# Patient Record
Sex: Female | Born: 1975 | Race: White | Hispanic: No | Marital: Married | Smoking: Never smoker
Health system: Southern US, Community
[De-identification: ages and names within clinical notes are randomized; demographics above are authoritative.]

---

## 2015-04-06 ENCOUNTER — Emergency Department (HOSPITAL_BASED_OUTPATIENT_CLINIC_OR_DEPARTMENT_OTHER)
Admission: EM | Admit: 2015-04-06 | Discharge: 2015-04-07 | Disposition: A | Discharge status: Home / Self Care | Payer: Managed Care, Other (non HMO) | Attending: Emergency Medicine | Admitting: Emergency Medicine

## 2015-04-06 ENCOUNTER — Encounter (HOSPITAL_BASED_OUTPATIENT_CLINIC_OR_DEPARTMENT_OTHER): Payer: Self-pay | Admitting: *Deleted

## 2015-04-06 DIAGNOSIS — R1084 Generalized abdominal pain: Secondary | ICD-10-CM | POA: Diagnosis not present

## 2015-04-06 DIAGNOSIS — Z3202 Encounter for pregnancy test, result negative: Secondary | ICD-10-CM | POA: Insufficient documentation

## 2015-04-06 DIAGNOSIS — R112 Nausea with vomiting, unspecified: Secondary | ICD-10-CM | POA: Diagnosis not present

## 2015-04-06 DIAGNOSIS — R1013 Epigastric pain: Secondary | ICD-10-CM | POA: Diagnosis present

## 2015-04-06 LAB — PREGNANCY, URINE: Preg Test, Ur: NEGATIVE

## 2015-04-06 LAB — CBC
HEMATOCRIT: 45.6 % (ref 36.0–46.0)
Hemoglobin: 15.1 g/dL — ABNORMAL HIGH (ref 12.0–15.0)
MCH: 29 pg (ref 26.0–34.0)
MCHC: 33.1 g/dL (ref 30.0–36.0)
MCV: 87.5 fL (ref 78.0–100.0)
Platelets: 316 10*3/uL (ref 150–400)
RBC: 5.21 MIL/uL — AB (ref 3.87–5.11)
RDW: 12.8 % (ref 11.5–15.5)
WBC: 8.7 10*3/uL (ref 4.0–10.5)

## 2015-04-06 LAB — COMPREHENSIVE METABOLIC PANEL
ALK PHOS: 117 U/L (ref 38–126)
ALT: 37 U/L (ref 14–54)
AST: 23 U/L (ref 15–41)
Albumin: 4.3 g/dL (ref 3.5–5.0)
Anion gap: 8 (ref 5–15)
BILIRUBIN TOTAL: 0.6 mg/dL (ref 0.3–1.2)
BUN: 14 mg/dL (ref 6–20)
CALCIUM: 8.7 mg/dL — AB (ref 8.9–10.3)
CO2: 28 mmol/L (ref 22–32)
Chloride: 103 mmol/L (ref 101–111)
Creatinine, Ser: 1.05 mg/dL — ABNORMAL HIGH (ref 0.44–1.00)
GFR calc Af Amer: >60 mL/min (ref >60)
GFR calc non Af Amer: >60 mL/min (ref >60)
GLUCOSE: 173 mg/dL — AB (ref 65–99)
Potassium: 3.8 mmol/L (ref 3.5–5.1)
Sodium: 139 mmol/L (ref 135–145)
TOTAL PROTEIN: 7.8 g/dL (ref 6.5–8.1)

## 2015-04-06 LAB — URINALYSIS, ROUTINE W REFLEX MICROSCOPIC
Bilirubin Urine: NEGATIVE
Glucose, UA: NEGATIVE mg/dL
Hgb urine dipstick: NEGATIVE
Ketones, ur: NEGATIVE mg/dL
Leukocytes, UA: NEGATIVE
NITRITE: NEGATIVE
Protein, ur: NEGATIVE mg/dL
Specific Gravity, Urine: 1.022 (ref 1.005–1.030)
UROBILINOGEN UA: 0.2 mg/dL (ref 0.0–1.0)
pH: 6 (ref 5.0–8.0)

## 2015-04-06 LAB — LIPASE, BLOOD: LIPASE: 18 U/L — AB (ref 22–51)

## 2015-04-06 NOTE — ED Notes (Signed)
Pt c/o diffuse abd pain which radiates around both flanks x 3 days

## 2015-04-07 ENCOUNTER — Encounter (HOSPITAL_BASED_OUTPATIENT_CLINIC_OR_DEPARTMENT_OTHER): Payer: Self-pay | Admitting: Emergency Medicine

## 2015-04-07 ENCOUNTER — Ambulatory Visit (HOSPITAL_COMMUNITY)
Admission: RE | Admit: 2015-04-07 | Discharge: 2015-04-07 | Disposition: A | Discharge status: Home / Self Care | Payer: Managed Care, Other (non HMO) | Source: Ambulatory Visit | Attending: Emergency Medicine | Admitting: Emergency Medicine

## 2015-04-07 DIAGNOSIS — R109 Unspecified abdominal pain: Secondary | ICD-10-CM | POA: Diagnosis not present

## 2015-04-07 NOTE — ED Provider Notes (Signed)
CSN: [id number]     Arrival date & time 04/06/15  2221 History   First MD Initiated Contact with Patient 04/07/15 0103     Chief Complaint  Patient presents with  . Abdominal Pain     (Consider location/radiation/quality/duration/timing/severity/associated sxs/prior Treatment) HPI  This is a 39 year old female without significant past medical history. She is here with abdominal pain that began about 3 hours prior to arrival. The pain originated in the epigastrium then spread diffusely around her entire abdomen and flanks. She describes the pain as feeling like a basketball was inflating inside her. There was associated nausea and vomiting but no diarrhea. Her pain was a 10 out of 10 at its worst but has subsequently improved and is now only a 3 out of 10. She was not aware of having a fever but felt hot and cold at times. The pain was present before she ate but worsened after she ate. The pain is not worse with movement or palpation. She does have a family history of cholelithiasis.  History reviewed. No pertinent past medical history. History reviewed. No pertinent past surgical history. History reviewed. No pertinent family history. History  Substance Use Topics  . Smoking status: Never Smoker   . Smokeless tobacco: Not on file  . Alcohol Use: No   OB History    No data available     Review of Systems  All other systems reviewed and are negative.   Allergies  Pneumococcal vaccines  Home Medications   Prior to Admission medications   Not on File   BP 143/95 mmHg  Pulse 79  Temp(Src) 98.1 F (36.7 C) (Oral)  Resp 18  Ht 5\' 10"  (1.778 m)  Wt 270 lb (122.471 kg)  BMI 38.74 kg/m2  SpO2 95%   Physical Exam  General: Well-developed, obese female in no acute distress; appearance consistent with age of record HENT: normocephalic; atraumatic Eyes: pupils equal, round and reactive to light; extraocular muscles intact Neck: supple Heart: regular rate and rhythm Lungs:  clear to auscultation bilaterally Abdomen: soft; nondistended; nontender; no masses or hepatosplenomegaly; bowel sounds present GU: No CVA tenderness Extremities: No deformity; full range of motion; pulses normal Neurologic: Awake, alert and oriented; motor function intact in all extremities and symmetric; no facial droop Skin: Warm and dry Psychiatric: Normal mood and affect    ED Course  Procedures (including critical care time)   MDM   Nursing notes and vitals signs, including pulse oximetry, reviewed.  Summary of this visit's results, reviewed by myself:  Labs:  Results for orders placed or performed during the hospital encounter of 04/06/15 (from the past 24 hour(s))  Pregnancy, urine     Status: None   Collection Time: 04/06/15 10:40 PM  Result Value Ref Range   Preg Test, Ur NEGATIVE NEGATIVE  Urinalysis, Routine w reflex microscopic (not at [facility])     Status: None   Collection Time: 04/06/15 10:40 PM  Result Value Ref Range   Color, Urine YELLOW YELLOW   APPearance CLEAR CLEAR   Specific Gravity, Urine 1.022 1.005 - 1.030   pH 6.0 5.0 - 8.0   Glucose, UA NEGATIVE NEGATIVE mg/dL   Hgb urine dipstick NEGATIVE NEGATIVE   Bilirubin Urine NEGATIVE NEGATIVE   Ketones, ur NEGATIVE NEGATIVE mg/dL   Protein, ur NEGATIVE NEGATIVE mg/dL   Urobilinogen, UA 0.2 0.0 - 1.0 mg/dL   Nitrite NEGATIVE NEGATIVE   Leukocytes, UA NEGATIVE NEGATIVE  CBC     Status: Abnormal   Collection  Time: 04/06/15 10:40 PM  Result Value Ref Range   WBC 8.7 4.0 - 10.5 K/uL   RBC 5.21 (H) 3.87 - 5.11 MIL/uL   Hemoglobin 15.1 (H) 12.0 - 15.0 g/dL   HCT 16.1 09.6 - 04.5 %   MCV 87.5 78.0 - 100.0 fL   MCH 29.0 26.0 - 34.0 pg   MCHC 33.1 30.0 - 36.0 g/dL   RDW 40.9 81.1 - 91.4 %   Platelets 316 150 - 400 K/uL  Comprehensive metabolic panel     Status: Abnormal   Collection Time: 04/06/15 10:40 PM  Result Value Ref Range   Sodium 139 135 - 145 mmol/L   Potassium 3.8 3.5 - 5.1 mmol/L   Chloride  103 101 - 111 mmol/L   CO2 28 22 - 32 mmol/L   Glucose, Bld 173 (H) 65 - 99 mg/dL   BUN 14 6 - 20 mg/dL   Creatinine, Ser 7.82 (H) 0.44 - 1.00 mg/dL   Calcium 8.7 (L) 8.9 - 10.3 mg/dL   Total Protein 7.8 6.5 - 8.1 g/dL   Albumin 4.3 3.5 - 5.0 g/dL   AST 23 15 - 41 U/L   ALT 37 14 - 54 U/L   Alkaline Phosphatase 117 38 - 126 U/L   Total Bilirubin 0.6 0.3 - 1.2 mg/dL   GFR calc non Af Amer >60 >60 mL/min   GFR calc Af Amer >60 >60 mL/min   Anion gap 8 5 - 15  Lipase, blood     Status: Abnormal   Collection Time: 04/06/15 10:40 PM  Result Value Ref Range   Lipase 18 (L) 22 - 51 U/L   1:19 AM The patient's symptoms are improving and her lab work is unremarkable. Her abdomen is nontender. I do not feel a CT scan is indicated the present time. We will have her return for an ultrasound later this morning. She was advised she is should return should symptoms worsen or change and we will reconsider CT scan.  Paula Libra, MD 04/07/15 0120

## 2015-04-07 NOTE — ED Notes (Signed)
Unable to make an appointment for [place] for pt d/t no answer from [facility] or [employer], no [place] here until Monday; pt was given instructions to call both in the am to make an appointment; if pain worsens come back.

## 2016-09-22 IMAGING — US US ABDOMEN COMPLETE
1 series · 13 of 25 positions shown · non-contrast
Comparison: None.

CLINICAL DATA: Abdominal pain for 6 months.

EXAM:
ULTRASOUND ABDOMEN COMPLETE

[Series 1: us abdomen complete · 0.22mm/px · 13 of 81 slices shown]
[im 1/81]
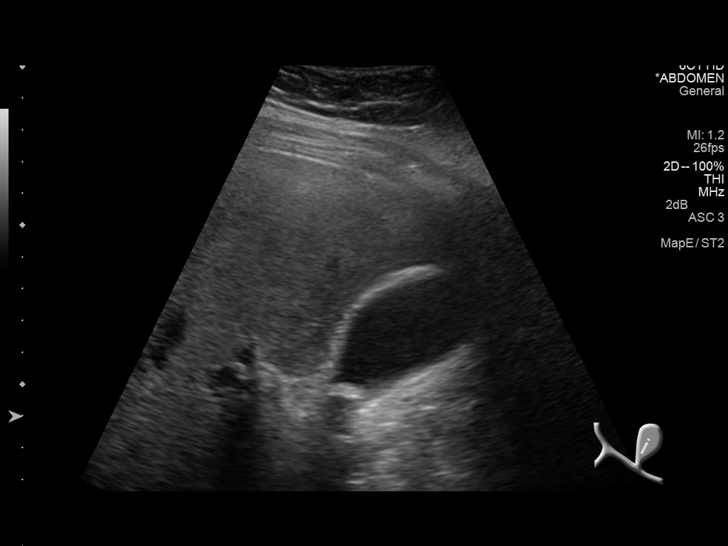
[im 7/81]
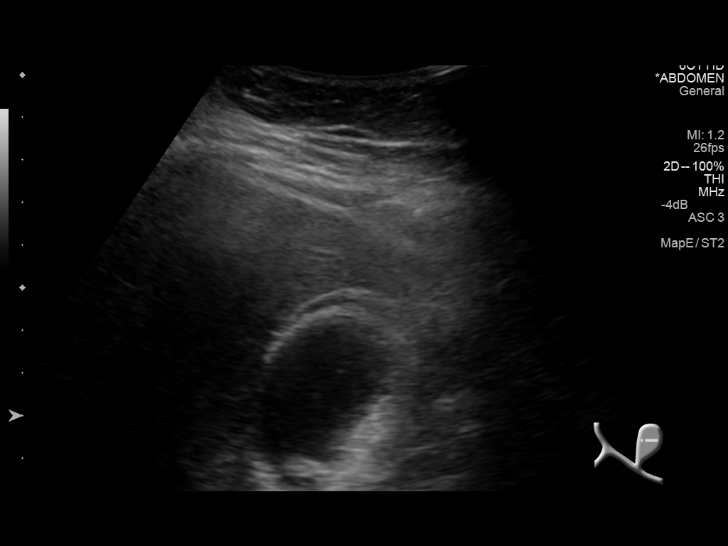
[im 14/81]
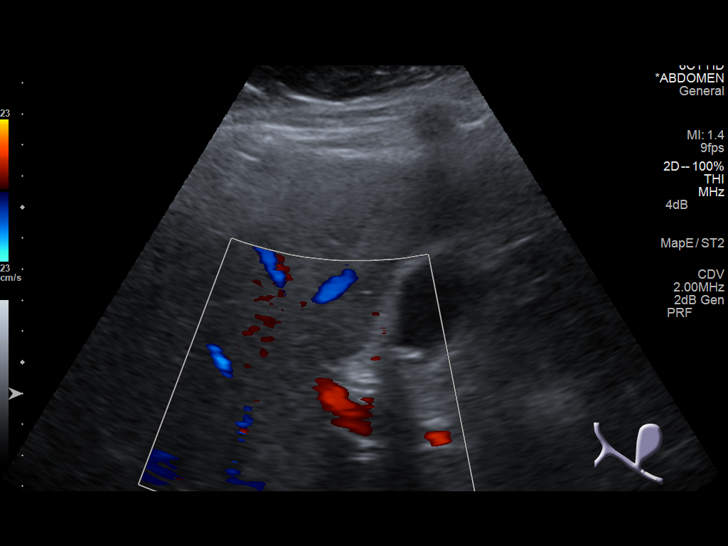
[im 21/81]
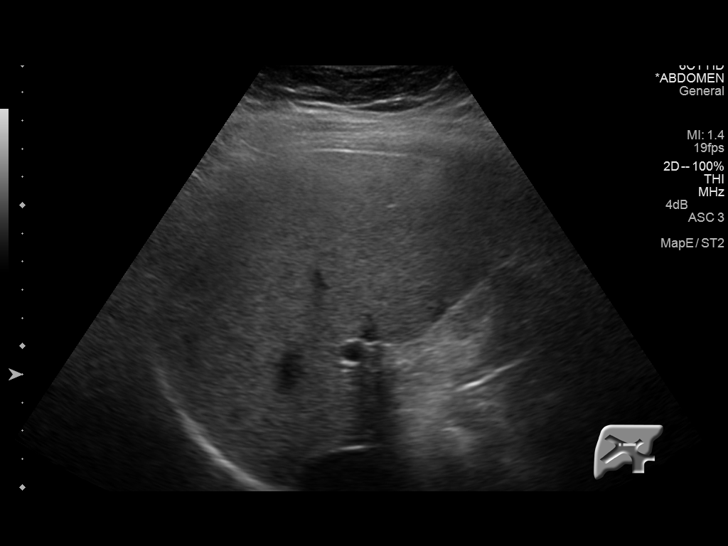
[im 27/81]
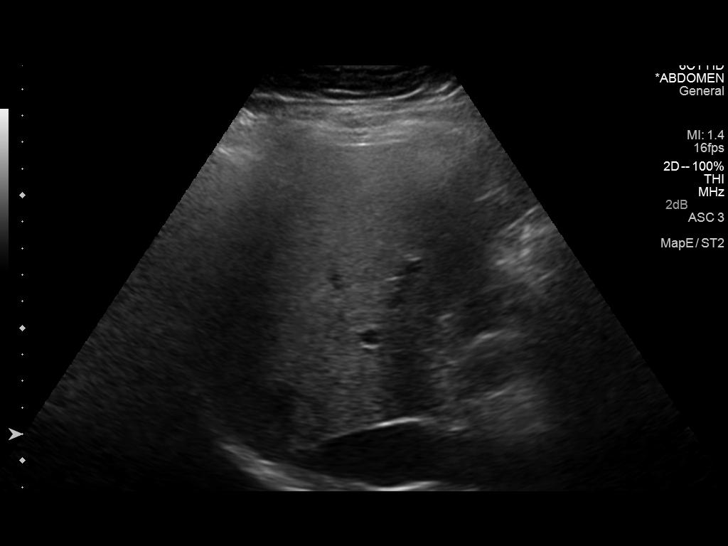
[im 34/81]
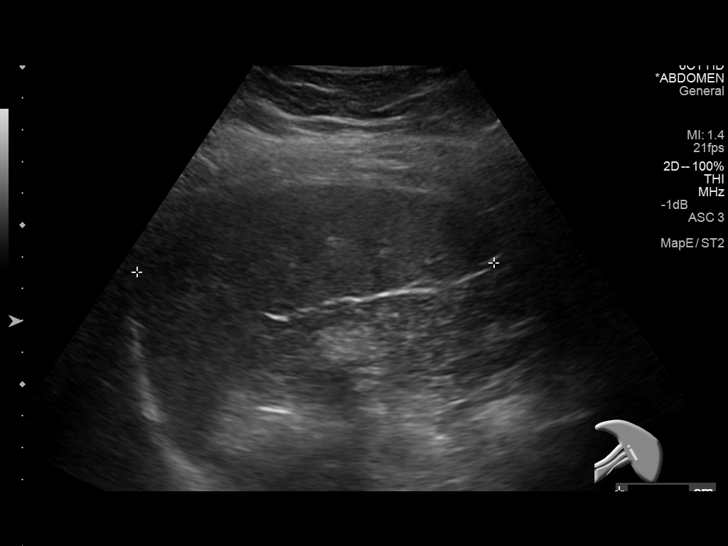
[im 41/81]
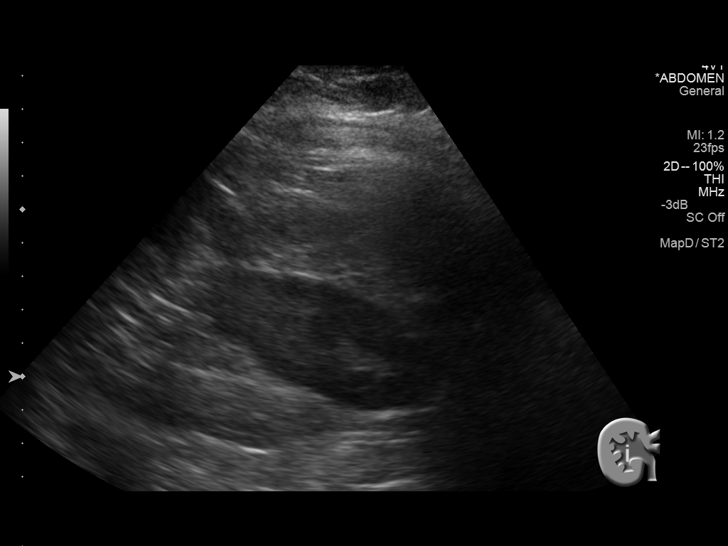
[im 47/81]
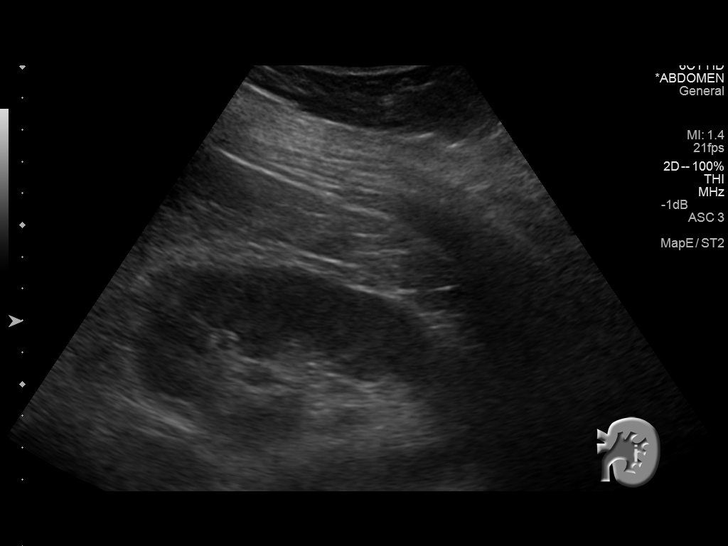
[im 54/81]
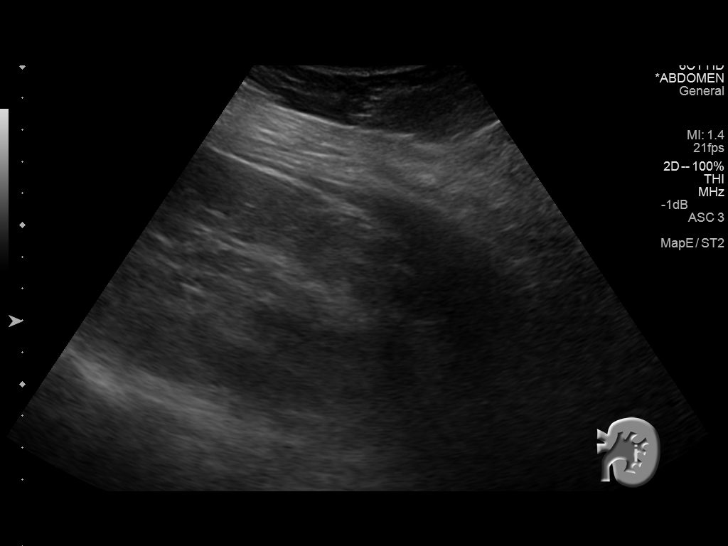
[im 61/81]
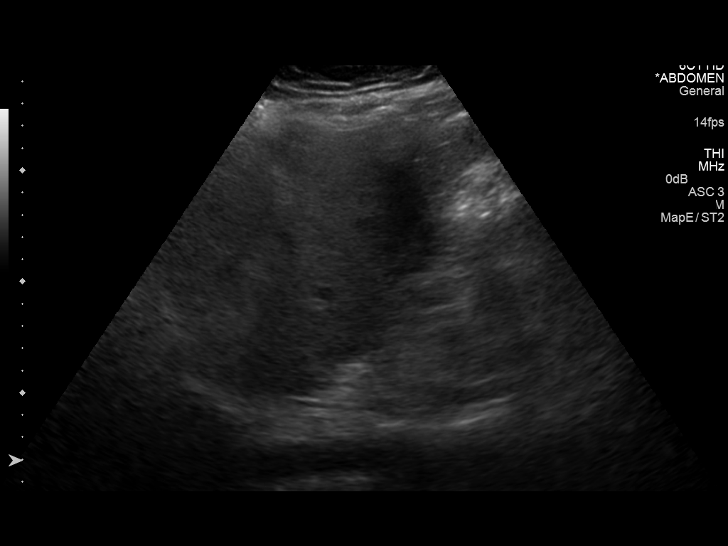
[im 67/81]
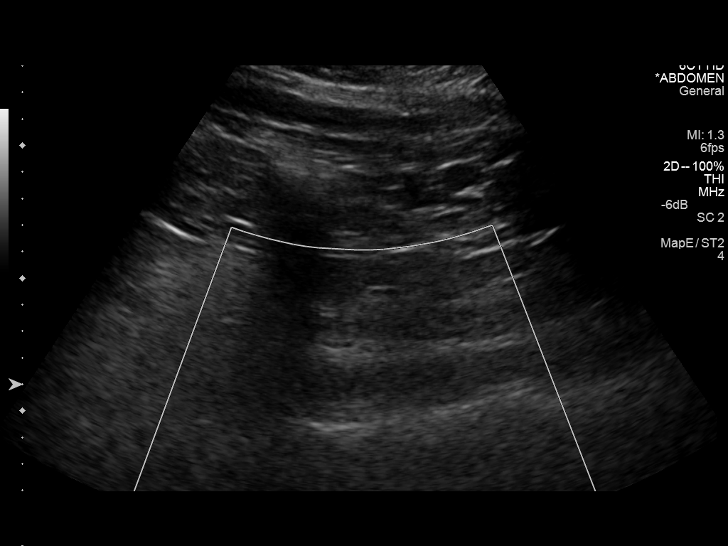
[im 74/81]
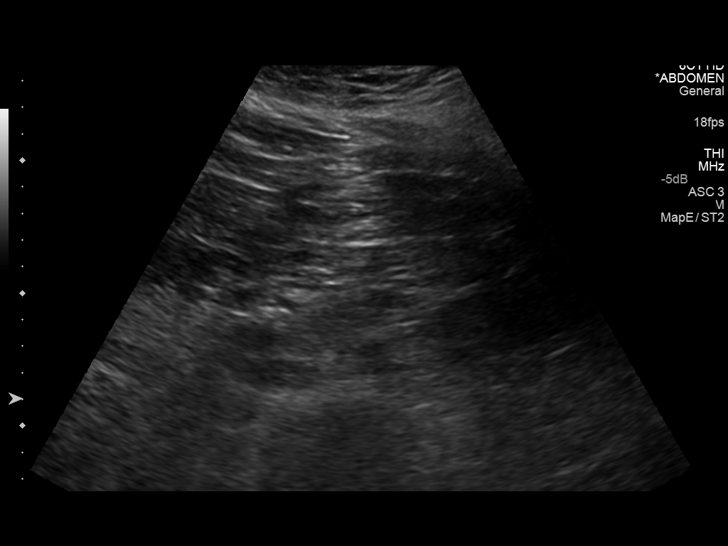
[im 81/81]
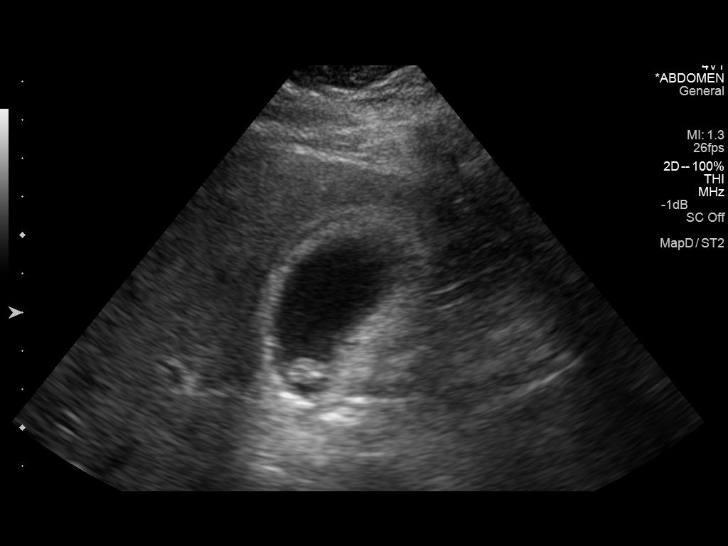

[13 of 25 positions shown; findings below may reference images not displayed]

FINDINGS: Gallbladder: Gallbladder wall appeared edematous and thickened,
measuring 6 mm. 1.3 cm non mobile stone was present in the region of
the gallbladder neck. Sonographic Murphy's sign was negative.

Common bile duct: Diameter: 2 mm

Liver: Diffusely echogenic likely reflecting hepatic steatosis with
focal fatty sparing adjacent to the gallbladder.

IVC: No abnormality visualized.  Infrahepatic IVC not visualized.

Pancreas: Not well seen.

Spleen: Size and appearance within normal limits.

Right Kidney: Length: 11.8 cm. Echogenicity within normal limits. No
mass or hydronephrosis visualized.

Left Kidney: Length: 12.6 cm. Malrotated. Echogenicity within normal
limits. No mass or hydronephrosis visualized.

Abdominal aorta: No aneurysm visualized.  Bifurcation not well seen.

Other findings: None.
IMPRESSION: Gallbladder wall thickening with non mobile stone in the gallbladder
neck. This could represent acute cholecystitis in the appropriate
clinical setting. If further imaging evaluation is clinically
desired, nuclear medicine HIDA scan may be helpful.

These results will be called to the ordering clinician or
representative by the Radiologist Assistant, and communication
documented in the PACS or zVision Dashboard.
